# Patient Record
Sex: Male | Born: 1950 | Race: White | Hispanic: No | Marital: Single
Health system: Southern US, Community
[De-identification: ages and names within clinical notes are randomized; demographics above are authoritative.]

---

## 2000-06-11 ENCOUNTER — Inpatient Hospital Stay (HOSPITAL_COMMUNITY): Admission: EM | Admit: 2000-06-11 | Discharge: 2000-06-12 | Payer: Self-pay | Admitting: Emergency Medicine

## 2001-02-10 ENCOUNTER — Emergency Department (HOSPITAL_COMMUNITY): Admission: EM | Admit: 2001-02-10 | Discharge: 2001-02-10 | Payer: Self-pay | Admitting: Emergency Medicine

## 2002-10-15 ENCOUNTER — Emergency Department (HOSPITAL_COMMUNITY): Admission: EM | Admit: 2002-10-15 | Discharge: 2002-10-15 | Payer: Self-pay | Admitting: Emergency Medicine

## 2004-10-16 ENCOUNTER — Ambulatory Visit: Payer: Self-pay

## 2013-06-15 ENCOUNTER — Inpatient Hospital Stay: Payer: Self-pay | Admitting: Internal Medicine

## 2013-06-15 LAB — URINALYSIS, COMPLETE
Bacteria: NONE SEEN
Bilirubin,UR: NEGATIVE
Blood: NEGATIVE
Glucose,UR: NEGATIVE mg/dL (ref 0–75)
Ketone: NEGATIVE
Leukocyte Esterase: NEGATIVE
Nitrite: NEGATIVE
Ph: 6 (ref 4.5–8.0)
Protein: NEGATIVE
SPECIFIC GRAVITY: 1.004 (ref 1.003–1.030)
SQUAMOUS EPITHELIAL: NONE SEEN
WBC UR: NONE SEEN /HPF (ref 0–5)

## 2013-06-15 LAB — CBC
HCT: 44.2 % (ref 40.0–52.0)
HGB: 15.1 g/dL (ref 13.0–18.0)
MCH: 31.6 pg (ref 26.0–34.0)
MCHC: 34.3 g/dL (ref 32.0–36.0)
MCV: 92 fL (ref 80–100)
PLATELETS: 222 10*3/uL (ref 150–440)
RBC: 4.79 10*6/uL (ref 4.40–5.90)
RDW: 13.5 % (ref 11.5–14.5)
WBC: 6.7 10*3/uL (ref 3.8–10.6)

## 2013-06-15 LAB — COMPREHENSIVE METABOLIC PANEL
Albumin: 3.4 g/dL (ref 3.4–5.0)
Alkaline Phosphatase: 66 U/L
Anion Gap: 2 — ABNORMAL LOW (ref 7–16)
BUN: 12 mg/dL (ref 7–18)
Bilirubin,Total: 0.4 mg/dL (ref 0.2–1.0)
Calcium, Total: 9 mg/dL (ref 8.5–10.1)
Chloride: 103 mmol/L (ref 98–107)
Co2: 30 mmol/L (ref 21–32)
Creatinine: 1.03 mg/dL (ref 0.60–1.30)
EGFR (African American): >60
GLUCOSE: 99 mg/dL (ref 65–99)
OSMOLALITY: 270 (ref 275–301)
POTASSIUM: 3.5 mmol/L (ref 3.5–5.1)
SGOT(AST): 22 U/L (ref 15–37)
SGPT (ALT): 18 U/L (ref 12–78)
Sodium: 135 mmol/L — ABNORMAL LOW (ref 136–145)
Total Protein: 6.4 g/dL (ref 6.4–8.2)

## 2013-06-15 LAB — MAGNESIUM: MAGNESIUM: 1.7 mg/dL — AB

## 2013-06-15 LAB — TROPONIN I

## 2013-06-16 LAB — MAGNESIUM: Magnesium: 2 mg/dL

## 2013-06-16 LAB — LIPID PANEL
Cholesterol: 112 mg/dL (ref 0–200)
HDL Cholesterol: 34 mg/dL — ABNORMAL LOW (ref 40–60)
Ldl Cholesterol, Calc: 60 mg/dL (ref 0–100)
Triglycerides: 92 mg/dL (ref 0–200)
VLDL CHOLESTEROL, CALC: 18 mg/dL (ref 5–40)

## 2014-01-21 ENCOUNTER — Emergency Department: Payer: Self-pay | Admitting: Emergency Medicine

## 2014-01-21 LAB — CBC WITH DIFFERENTIAL/PLATELET
Basophil #: 0.1 10*3/uL (ref 0.0–0.1)
Basophil %: 0.8 %
Eosinophil #: 0.3 10*3/uL (ref 0.0–0.7)
Eosinophil %: 3.6 %
HCT: 46.2 % (ref 40.0–52.0)
HGB: 15.6 g/dL (ref 13.0–18.0)
Lymphocyte #: 1.2 10*3/uL (ref 1.0–3.6)
Lymphocyte %: 12.5 %
MCH: 31.6 pg (ref 26.0–34.0)
MCHC: 33.7 g/dL (ref 32.0–36.0)
MCV: 94 fL (ref 80–100)
Monocyte #: 0.9 x10 3/mm (ref 0.2–1.0)
Monocyte %: 9.8 %
Neutrophil #: 7.1 10*3/uL — ABNORMAL HIGH (ref 1.4–6.5)
Neutrophil %: 73.3 %
Platelet: 272 10*3/uL (ref 150–440)
RBC: 4.93 10*6/uL (ref 4.40–5.90)
RDW: 13.9 % (ref 11.5–14.5)
WBC: 9.6 10*3/uL (ref 3.8–10.6)

## 2014-01-21 LAB — URINALYSIS, COMPLETE
Bacteria: NONE SEEN
Bilirubin,UR: NEGATIVE
Blood: NEGATIVE
Glucose,UR: NEGATIVE mg/dL (ref 0–75)
Ketone: NEGATIVE
Leukocyte Esterase: NEGATIVE
Nitrite: NEGATIVE
Ph: 6 (ref 4.5–8.0)
Protein: NEGATIVE
RBC,UR: <1 /HPF (ref 0–5)
Specific Gravity: 1.008 (ref 1.003–1.030)
Squamous Epithelial: NONE SEEN
WBC UR: <1 /HPF (ref 0–5)

## 2014-01-21 LAB — BASIC METABOLIC PANEL
Anion Gap: 5 — ABNORMAL LOW (ref 7–16)
BUN: 10 mg/dL (ref 7–18)
Calcium, Total: 8.7 mg/dL (ref 8.5–10.1)
Chloride: 106 mmol/L (ref 98–107)
Co2: 27 mmol/L (ref 21–32)
Creatinine: 0.88 mg/dL (ref 0.60–1.30)
EGFR (African American): >60
EGFR (Non-African Amer.): >60
Glucose: 100 mg/dL — ABNORMAL HIGH (ref 65–99)
Osmolality: 275 (ref 275–301)
Potassium: 4 mmol/L (ref 3.5–5.1)
Sodium: 138 mmol/L (ref 136–145)

## 2014-01-21 LAB — TROPONIN I: Troponin-I: <0.02 ng/mL

## 2014-01-26 ENCOUNTER — Emergency Department: Payer: Self-pay | Admitting: Emergency Medicine

## 2014-03-23 ENCOUNTER — Ambulatory Visit: Payer: Self-pay | Admitting: Internal Medicine

## 2014-04-10 ENCOUNTER — Inpatient Hospital Stay: Payer: Self-pay | Admitting: Internal Medicine

## 2014-04-10 LAB — COMPREHENSIVE METABOLIC PANEL
Albumin: 3.5 g/dL (ref 3.4–5.0)
Alkaline Phosphatase: 91 U/L
Anion Gap: 13 (ref 7–16)
BUN: 24 mg/dL — ABNORMAL HIGH (ref 7–18)
Bilirubin,Total: 1 mg/dL (ref 0.2–1.0)
Calcium, Total: 9.6 mg/dL (ref 8.5–10.1)
Chloride: 102 mmol/L (ref 98–107)
Co2: 23 mmol/L (ref 21–32)
Creatinine: 0.94 mg/dL (ref 0.60–1.30)
EGFR (African American): >60
EGFR (Non-African Amer.): >60
Glucose: 76 mg/dL (ref 65–99)
Osmolality: 278 (ref 275–301)
Potassium: 4.8 mmol/L (ref 3.5–5.1)
SGOT(AST): 101 U/L — ABNORMAL HIGH (ref 15–37)
SGPT (ALT): 36 U/L
Sodium: 138 mmol/L (ref 136–145)
Total Protein: 7.5 g/dL (ref 6.4–8.2)

## 2014-04-10 LAB — URINALYSIS, COMPLETE
Bacteria: NONE SEEN
Bilirubin,UR: NEGATIVE
Glucose,UR: NEGATIVE mg/dL (ref 0–75)
Leukocyte Esterase: NEGATIVE
Nitrite: NEGATIVE
Ph: 5 (ref 4.5–8.0)
RBC,UR: 1 /HPF (ref 0–5)
SPECIFIC GRAVITY: 1.021 (ref 1.003–1.030)
WBC UR: 1 /HPF (ref 0–5)

## 2014-04-10 LAB — PROTIME-INR
INR: 1
Prothrombin Time: 12.7 secs (ref 11.5–14.7)

## 2014-04-10 LAB — CK TOTAL AND CKMB (NOT AT ARMC)
CK, Total: 2929 U/L — ABNORMAL HIGH (ref 39–308)
CK-MB: 16.8 ng/mL — ABNORMAL HIGH (ref 0.5–3.6)

## 2014-04-10 LAB — CBC
HCT: 53.9 % — ABNORMAL HIGH (ref 40.0–52.0)
HGB: 17.7 g/dL (ref 13.0–18.0)
MCH: 30.7 pg (ref 26.0–34.0)
MCHC: 32.8 g/dL (ref 32.0–36.0)
MCV: 93 fL (ref 80–100)
Platelet: 271 10*3/uL (ref 150–440)
RBC: 5.77 10*6/uL (ref 4.40–5.90)
RDW: 13.5 % (ref 11.5–14.5)
WBC: 20.7 10*3/uL — ABNORMAL HIGH (ref 3.8–10.6)

## 2014-04-10 LAB — APTT: Activated PTT: <23 secs — ABNORMAL LOW (ref 23.6–35.9)

## 2014-04-10 LAB — TROPONIN I: Troponin-I: 0.02 ng/mL

## 2014-04-11 LAB — CBC WITH DIFFERENTIAL/PLATELET
Basophil #: 0.1 10*3/uL (ref 0.0–0.1)
Basophil %: 0.6 %
EOS PCT: 0.9 %
Eosinophil #: 0.1 10*3/uL (ref 0.0–0.7)
HCT: 38.7 % — ABNORMAL LOW (ref 40.0–52.0)
HGB: 12.9 g/dL — ABNORMAL LOW (ref 13.0–18.0)
LYMPHS PCT: 5.9 %
Lymphocyte #: 0.7 10*3/uL — ABNORMAL LOW (ref 1.0–3.6)
MCH: 31.1 pg (ref 26.0–34.0)
MCHC: 33.4 g/dL (ref 32.0–36.0)
MCV: 93 fL (ref 80–100)
MONO ABS: 1.2 x10 3/mm — AB (ref 0.2–1.0)
Monocyte %: 9.4 %
NEUTROS ABS: 10.5 10*3/uL — AB (ref 1.4–6.5)
NEUTROS PCT: 83.2 %
Platelet: 192 10*3/uL (ref 150–440)
RBC: 4.15 10*6/uL — ABNORMAL LOW (ref 4.40–5.90)
RDW: 13.1 % (ref 11.5–14.5)
WBC: 12.6 10*3/uL — ABNORMAL HIGH (ref 3.8–10.6)

## 2014-04-11 LAB — BASIC METABOLIC PANEL
Anion Gap: 6 — ABNORMAL LOW (ref 7–16)
BUN: 24 mg/dL — ABNORMAL HIGH (ref 7–18)
CALCIUM: 7.9 mg/dL — AB (ref 8.5–10.1)
CHLORIDE: 110 mmol/L — AB (ref 98–107)
Co2: 24 mmol/L (ref 21–32)
Creatinine: 0.95 mg/dL (ref 0.60–1.30)
EGFR (African American): >60
Glucose: 92 mg/dL (ref 65–99)
OSMOLALITY: 283 (ref 275–301)
POTASSIUM: 4.2 mmol/L (ref 3.5–5.1)
Sodium: 140 mmol/L (ref 136–145)

## 2014-04-11 LAB — CK: CK, Total: 1303 U/L — ABNORMAL HIGH (ref 39–308)

## 2014-04-11 LAB — TSH: THYROID STIMULATING HORM: 0.448 u[IU]/mL — AB

## 2014-04-12 LAB — CBC WITH DIFFERENTIAL/PLATELET
BASOS ABS: 0 10*3/uL (ref 0.0–0.1)
Basophil %: 0.4 %
EOS ABS: 0.2 10*3/uL (ref 0.0–0.7)
Eosinophil %: 2.2 %
HCT: 38.6 % — ABNORMAL LOW (ref 40.0–52.0)
HGB: 12.8 g/dL — AB (ref 13.0–18.0)
LYMPHS PCT: 5.9 %
Lymphocyte #: 0.6 10*3/uL — ABNORMAL LOW (ref 1.0–3.6)
MCH: 31.1 pg (ref 26.0–34.0)
MCHC: 33.1 g/dL (ref 32.0–36.0)
MCV: 94 fL (ref 80–100)
MONOS PCT: 9.3 %
Monocyte #: 1 x10 3/mm (ref 0.2–1.0)
NEUTROS PCT: 82.2 %
Neutrophil #: 9 10*3/uL — ABNORMAL HIGH (ref 1.4–6.5)
Platelet: 167 10*3/uL (ref 150–440)
RBC: 4.11 10*6/uL — ABNORMAL LOW (ref 4.40–5.90)
RDW: 13.1 % (ref 11.5–14.5)
WBC: 10.9 10*3/uL — ABNORMAL HIGH (ref 3.8–10.6)

## 2014-04-12 LAB — BASIC METABOLIC PANEL
Anion Gap: 5 — ABNORMAL LOW (ref 7–16)
BUN: 12 mg/dL (ref 7–18)
CHLORIDE: 106 mmol/L (ref 98–107)
CO2: 26 mmol/L (ref 21–32)
Calcium, Total: 7.7 mg/dL — ABNORMAL LOW (ref 8.5–10.1)
Creatinine: 0.73 mg/dL (ref 0.60–1.30)
EGFR (African American): >60
EGFR (Non-African Amer.): >60
Glucose: 91 mg/dL (ref 65–99)
Osmolality: 273 (ref 275–301)
Potassium: 3.7 mmol/L (ref 3.5–5.1)
SODIUM: 137 mmol/L (ref 136–145)

## 2014-04-12 LAB — CK: CK, Total: 667 U/L — ABNORMAL HIGH (ref 39–308)

## 2014-04-13 LAB — CBC WITH DIFFERENTIAL/PLATELET
Basophil #: 0.1 10*3/uL (ref 0.0–0.1)
Basophil %: 0.6 %
Eosinophil #: 0.3 10*3/uL (ref 0.0–0.7)
Eosinophil %: 1.3 %
HCT: 44.3 % (ref 40.0–52.0)
HGB: 14.6 g/dL (ref 13.0–18.0)
Lymphocyte #: 0.7 10*3/uL — ABNORMAL LOW (ref 1.0–3.6)
Lymphocyte %: 3.9 %
MCH: 30.8 pg (ref 26.0–34.0)
MCHC: 32.9 g/dL (ref 32.0–36.0)
MCV: 94 fL (ref 80–100)
Monocyte #: 1.3 x10 3/mm — ABNORMAL HIGH (ref 0.2–1.0)
Monocyte %: 6.9 %
Neutrophil #: 16.7 10*3/uL — ABNORMAL HIGH (ref 1.4–6.5)
Neutrophil %: 87.3 %
Platelet: 240 10*3/uL (ref 150–440)
RBC: 4.74 10*6/uL (ref 4.40–5.90)
RDW: 13 % (ref 11.5–14.5)
WBC: 19.1 10*3/uL — ABNORMAL HIGH (ref 3.8–10.6)

## 2014-04-13 LAB — BASIC METABOLIC PANEL
Anion Gap: 5 — ABNORMAL LOW (ref 7–16)
BUN: 9 mg/dL (ref 7–18)
Calcium, Total: 7.6 mg/dL — ABNORMAL LOW (ref 8.5–10.1)
Chloride: 100 mmol/L (ref 98–107)
Co2: 31 mmol/L (ref 21–32)
Creatinine: 0.77 mg/dL (ref 0.60–1.30)
Glucose: 161 mg/dL — ABNORMAL HIGH (ref 65–99)
Osmolality: 274 (ref 275–301)
Potassium: 3.3 mmol/L — ABNORMAL LOW (ref 3.5–5.1)
Sodium: 136 mmol/L (ref 136–145)

## 2014-04-13 LAB — TSH: THYROID STIMULATING HORM: 1.67 u[IU]/mL

## 2014-04-14 DIAGNOSIS — I361 Nonrheumatic tricuspid (valve) insufficiency: Secondary | ICD-10-CM

## 2014-04-14 LAB — CBC WITH DIFFERENTIAL/PLATELET
BASOS ABS: 0 10*3/uL (ref 0.0–0.1)
BASOS PCT: 0.2 %
EOS PCT: 0.8 %
Eosinophil #: 0.2 10*3/uL (ref 0.0–0.7)
HCT: 45.6 % (ref 40.0–52.0)
HGB: 15.3 g/dL (ref 13.0–18.0)
Lymphocyte #: 0.5 10*3/uL — ABNORMAL LOW (ref 1.0–3.6)
Lymphocyte %: 2.6 %
MCH: 30.9 pg (ref 26.0–34.0)
MCHC: 33.6 g/dL (ref 32.0–36.0)
MCV: 92 fL (ref 80–100)
MONO ABS: 1.6 x10 3/mm — AB (ref 0.2–1.0)
Monocyte %: 7.8 %
Neutrophil #: 18.3 10*3/uL — ABNORMAL HIGH (ref 1.4–6.5)
Neutrophil %: 88.6 %
PLATELETS: 271 10*3/uL (ref 150–440)
RBC: 4.96 10*6/uL (ref 4.40–5.90)
RDW: 13 % (ref 11.5–14.5)
WBC: 20.6 10*3/uL — ABNORMAL HIGH (ref 3.8–10.6)

## 2014-04-14 LAB — BASIC METABOLIC PANEL
ANION GAP: 7 (ref 7–16)
BUN: 9 mg/dL (ref 7–18)
CO2: 31 mmol/L (ref 21–32)
Calcium, Total: 8.1 mg/dL — ABNORMAL LOW (ref 8.5–10.1)
Chloride: 98 mmol/L (ref 98–107)
Creatinine: 0.79 mg/dL (ref 0.60–1.30)
EGFR (Non-African Amer.): >60
Glucose: 162 mg/dL — ABNORMAL HIGH (ref 65–99)
Osmolality: 274 (ref 275–301)
POTASSIUM: 3.1 mmol/L — AB (ref 3.5–5.1)
SODIUM: 136 mmol/L (ref 136–145)

## 2014-04-23 ENCOUNTER — Ambulatory Visit: Payer: Self-pay | Admitting: Internal Medicine

## 2014-04-23 DEATH — deceased

## 2014-08-14 NOTE — H&P (Signed)
PATIENT NAME:  Charles Potter, Charles Potter MR#:  [id number] DATE OF BIRTH:  Jan 08, 1951  DATE OF ADMISSION:  06/15/2013  PRIMARY CARE PHYSICIAN:   Mikel Cella, Dr. Theda Sers.   PRIMARY ONCOLOGIST:  At [facility].   CHIEF COMPLAINT: Left-sided weakness.    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 Over the past day, the patient has had left-sided weakness. He has limited strength in the left lower extremity and left upper extremity. He also had some balance issues. Over the past week, the patient has some generalized weakness. No other symptoms such as slurred speech or other neurological deficits have been reported; however, when the patient was sitting in the ER, the family said that he lost consciousness and was unresponsive for several minutes. ER physician was called. She thought maybe the patient had a seizure. Neurology was consulted. Dr. Manuella Ghazi, from the ER, recommended Keppra. The patient's family denies any shaking or tongue biting. No history of seizures, although with his history of brain mets, he is at low threshold for seizures.   REVIEW OF SYSTEMS:  CONSTITUTIONAL: No fever. Positive weakness. No weight loss or weight gain.  EYES: No blurred or double vision, glaucoma or cataracts.  ENT:  Positive hearing loss. No snoring, postnasal drip or epistaxis.  RESPIRATORY: No cough, wheezing, hemoptysis. Positive COPD. CARDIOVASCULAR:  No chest pain, orthopnea, edema, arrhythmias, dyspnea on exertion, palpitations.  GASTROINTESTINAL:  No nausea, vomiting, diarrhea, abdominal pain, melena or ulcers. GENITOURINARY:   No dysuria, hematuria. ENDOCRINE: No polyuria or polydipsia.  HEMATOLOGIC AND LYMPHATICS: No easy bruising, bleeding.  SKIN: No rash or lesions.  MUSCULOSKELETAL: Positive weakness on the left side.  NEUROLOGIC: No history of CVA or TIA.   PSYCHIATRIC: No history of depression.   PAST MEDICAL HISTORY: 1.  Lung cancer with mets to the brain.  2.  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  3.  COPD. 4.  Hyperlipidemia.    SOCIAL HISTORY: The patient smokes 1/2 pack a day. He has been trying to quit smoking. No  alcohol or IV drug use.   FAMILY HISTORY: Positive for Alzheimer's and uterine cancer.   PAST SURGICAL HISTORY:  CABG, 2 vessel, and appendectomy.   MEDICATIONS: 1.  Ranexa 1000 mg 1 tablet b.i.d.  2.  Proventil 2 puffs q.6 hours.  3.  Polyethylene glycol daily.  4.  Pantoprazole 40 mg daily.  5.  Nitroglycerin sublingual p.r.n. chest pain.  6.  Nasonex 2 sprays b.i.d.  7.  Metoprolol 25 daily.  8.  Metamucil 5 capsules daily.  9.  Magnesium oxide 400 mg b.i.d.  10.  Lorazepam 1 mg b.i.d.  11.  Imdur 30 mg daily.  12.  Flonase 1 spray daily.  13.  Docusate 100 mg t.i.d. p.r.n.  14.  Diphenhydramine 25 mg t.i.d. p.r.n.  15.  Plavix 75 mg daily.  16.  Budesonide/formoterol inhaler 2 puffs b.i.d. p.r.n.  17.  Atorvastatin 40 mg daily at bedtime.  18.  Aspirin 325 daily.   ALLERGIES:  CODEINE, ANAPHYLAXIS. MORPHINE, HALLUCINATIONS. PENICILLIN, HIVES. SULFA DRUGS.   PHYSICAL EXAMINATION: VITAL SIGNS: Temperature 97.5, pulse 68, respirations 18, blood pressure 118/75, 96% on room air.  GENERAL: The patient is very slow to respond, but does not appear to be in acute distress.  HEENT: Head is atraumatic. Pupils are round and reactive. Sclerae  anicteric. Mucous membranes are moist. Oropharynx is clear. NECK: Supple without JVD, carotid bruit or enlarged thyroid.  CARDIOVASCULAR: Regular rate and rhythm. No murmurs, gallops or rubs. PMI is not displaced.  LUNGS: Clear to auscultation without crackles, rales, rhonchi or wheezing. Normal to percussion.  ABDOMEN: Bowel sounds are positive. Nontender, nondistended. No hepatosplenomegaly. EXTREMITIES:  No clubbing, cyanosis or edema. NEUROLOGIC: Cranial  nerves II through XII are intact. He has 3/5 strength in the left lower extremity. He is not able to really lift the leg up, but he can move it somewhat. Left upper extremity: He has got about 4/5 strength. Right upper extremity 5/5 strength and right lower extremity 5/5 strength. Babinski sign is downgoing on both sides. Cerebellar exam is normal including finger-to-nose on the right side. He has weakness of the left side, so unable to do a good heel-to-shin and finger-to-nose test on the left side.   LABORATORY DATA: Urinalysis shows no LCE or nitrites. White blood cells 6.7, hemoglobin 15, hematocrit 44.2, platelets are 222. Sodium 135, potassium 3.5, chloride 103, bicarb 30, BUN 12, creatinine 1.03. Glucose is 99. ALT 18, AST 22, total protein 6.4, albumin 3.4. Alk phos is 66, bilirubin 0.4, calcium 9.0, magnesium 1.7. Troponin less than 0.02.   CT SCAN OF THE BRAIN: No evidence of acute ischemic or hemorrhagic infarct. No intracranial masses affected.   ASSESSMENT AND PLAN: A 64 year old male with a history of lung cancer with brain mets, who presents with left-sided weakness and possible seizure while in the ER, although the patient never had any kind of shaking or tremors or biting of the tongue.  1. Left-sided weakness, concern for cerebrovascular accident versus something related to metastatic disease to his brain. I have ordered an MRI for further clarification. If this is positive for a CVA, we will need to order an echocardiogram and carotid ultrasound. For now, we will continue Plavix. I have written for neuro checks q.4 hours, MRI of the brain as already mentioned. I will go ahead also and consult physical therapy.  2.  Questionable seizure versus syncope. The patient will be admitted to telemetry. Neurology has been consulted. We will continue Keppra as per neurology recommendations, and we will order EEG for the a.m.  3. Coronary artery disease/coronary artery bypass grafting. We will  continue outpatient medications.  4. Lung cancer. The patient receives his treatment at [facility]. For now, we will order an MRI. Further recommendations after this MRI of the brain.  5.  Hypo-magnesium.  We will replete.  6. History of chronic obstructive pulmonary disease, which seems stable. Continue outpatient medications.  7.  Tobacco dependence. I encouraged the patient to stop smoking. He is trying to quit smoking, which we continue to encourage. The patient was counseled for 3-1/2 minutes.   The patient is a FULL CODE STATUS.    TIME SPENT: Approximately 50 minutes.    ____________________________ Donell Beers. Benjie Karvonen, MD spm:dmm D: 06/15/2013 19:31:00 ET T: 06/15/2013 20:03:22 ET JOB#: [id number]  cc: Kyanna Mahrt P. Benjie Karvonen, MD, <Dictator> Alverda Skeans, MD at [facility] Tre[facility] MD ELECTRONICALLY SIGNED 06/16/2013 12:43

## 2014-08-14 NOTE — Consult Note (Signed)
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 Over the past day, the patient has had left-sided weakness GOT WORSE, IT STARTED IN 2008 PER PT. He has limited strength in the left lower extremity and left upper extremity. He also had some balance issues. Over the past week, the patient has some generalized weakness. No other symptoms such as slurred speech or other neurological deficits have been reported; however, when the patient was sitting in the ER, the family said that he lost consciousness and was unresponsive for several minutes. ER physician was called. She thought maybe the patient had a seizure. I TALKED TO ER PHYSICIAN WHO MENTION SUDDEN ONSET OF UNRESPONSIVENESS AFTER AN EPISODE OF STARING WITHOUT FOCAL TONIC/CLONIC EVENT, recommended Keppra. The patient's family denies any shaking or tongue biting. No history of seizures, although with his history of brain mets, he is at low threshold for seizures.   PSYCHIATRIC: No history of depression.  MEDICAL HISTORY: Lung cancer with mets to the brain.  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  COPD. Hyperlipidemia.   HISTORY: The patient smokes 1/2 pack a day. He has been trying to quit smoking. No  alcohol or IV drug use.  HISTORY: Positive for Alzheimer's and uterine cancer.  SURGICAL HISTORY:  CABG, 2 vessel, and appendectomy.    ROS:  General fatigue   HEENT no complaints   Lungs SOB   Cardiac no complaints   GI no complaints   GU no complaints   Musculoskeletal no complaints   Extremities no complaints   Skin no complaints   Neuro seizure   Endocrine no complaints   Psych no complaints   Past Medical/Surgical Hx:  Cancer,  Brain:   Allergies:  Codeine: Anaphylaxis  Penicillin: Hives  Morphine: Hallucinations  Sulfa drugs: Unknown  Vital Signs: **Vital Signs.:   24-Feb-15 12:50  Vital Signs Type Admission  Temperature Temperature (F) 98.3  Celsius 36.8  Temperature Source oral  Pulse Pulse 69  Respirations Respirations 18  Systolic BP Systolic BP 979  Diastolic BP (mmHg) Diastolic BP (mmHg) 76  Mean BP 89  Pulse Ox % Pulse Ox % 95  Pulse Ox Activity Level  At rest  Oxygen Delivery Room Air/ 21 %   EXAM: General Exam Patient looks appropriate of age, bald, warning glasses, well built, nourished and appropriately groomed.   Cardiovascular Exam: S1, S2 heart sounds present Carotid exam revealed no bruit Lung exam ronchi on right apical region. belly soft  Neurological Exam      Mental Status:      Alert,     Oriented to time, place, person and situation     Attention span and concentration seemed reduced     Memory seemed OK.     Intact naming, repetition, comprehension.       Followed 2 step commands - no dysarthria     Fund of knowledge seemed appropriate for age and health status.       Cranial Nerves:      Olfactory and vagus nerves not are examined      Visual fields were full      Pupils were equal, round and reactive to light and accommodation  Extra-ocular movements are normal      Facial sensations are normal      Face is symmetric      Finger rub was heard symmetric in both ears      Palate and uvular movements are normal and oral sensations are OK      Neck muscle strength and shoulder shrug is normal      Tongue protrusion and uvular elevation are normal       Motor Exam:      Tone is normal in all extremities, poor strength 4-/5 in left LE, 4+/5 right LE. ? left UE weakness vs poor efforts.      No abnormal movements, fasciculations or atrophy seen       Deep Tendon Reflexes:      symmetric 1 +      Right Toes are down going,  Left Toes are down going             Sensory Exam:      Sensations were reduced to light touch in all extremities            Co-ordination:      Finger to nose is normal            Gait:      Gait very slow and unsteady.  Lab Results: Hepatic:  23-Feb-15 16:36   Bilirubin, Total 0.4  Alkaline Phosphatase 66 (45-117 NOTE: New Reference Range 03/13/13)  SGPT (ALT) 18  SGOT (AST) 22  Total Protein, Serum 6.4  Albumin, Serum 3.4  Routine Chem:  23-Feb-15 16:36   Glucose, Serum 99  BUN 12  Creatinine (comp) 1.03  Sodium, Serum  135  Potassium, Serum 3.5  Chloride, Serum 103  CO2, Serum 30  Calcium (Total), Serum 9.0  Osmolality (calc) 270  eGFR (African American) >60  eGFR (Non-African American) >60 (eGFR values <56m/min/1.73 m2 may be an indication of chronic kidney disease (CKD). Calculated eGFR is useful in patients with stable renal function. The eGFR calculation will not be reliable in acutely ill patients when serum creatinine is changing rapidly. It is not useful in  patients on dialysis. The eGFR calculation may not be applicable to patients at the low and high extremes of body sizes, pregnant women, and vegetarians.)  Anion Gap  2  24-Feb-15 03:41   Magnesium, Serum 2.0 (1.8-2.4 THERAPEUTIC RANGE: 4-7 mg/dL TOXIC: > 10 mg/dL  -----------------------)  Cholesterol, Serum 112  Triglycerides, Serum 92  HDL (INHOUSE)  34  VLDL Cholesterol Calculated 18  LDL Cholesterol Calculated 60 (Result(s) reported on 16 Jun 2013 at 04:19AM.)  Cardiac:  23-Feb-15 16:36   Troponin I < 0.02 (0.00-0.05 0.05 ng/mL or less: NEGATIVE  Repeat testing in 3-6 hrs  if clinically indicated. >0.05 ng/mL: POTENTIAL  MYOCARDIAL INJURY. Repeat  testing in 3-6 hrs if  clinically indicated. NOTE: An increase or decrease  of 30% or more on serial  testing suggests a  clinically important change)  Routine UA:  23-Feb-15 16:57   Color (UA) Straw  Clarity (UA) Clear  Glucose (UA) Negative  Bilirubin (UA) Negative   Ketones (UA) Negative  Specific Gravity (UA) 1.004  Blood (UA) Negative  pH (UA) 6.0  Protein (UA) Negative  Nitrite (UA) Negative  Leukocyte Esterase (UA) Negative (Result(s) reported on 15 Jun 2013 at 05:28PM.)  RBC (UA) <1 /HPF  WBC (UA) NONE SEEN  Bacteria (UA) NONE SEEN  Epithelial Cells (UA) NONE SEEN  Mucous (UA) PRESENT (Result(s) reported on 15 Jun 2013 at 05:28PM.)  Routine Hem:  23-Feb-15 16:36   WBC (CBC) 6.7  RBC (CBC) 4.79  Hemoglobin (CBC) 15.1  Hematocrit (CBC) 44.2  Platelet Count (CBC) 222 (Result(s) reported on 15 Jun 2013 at 05:17PM.)  MCV 92  MCH 31.6  MCHC 34.3  RDW 13.5   Radiology Results: CT:    23-Feb-15 17:19, CT Head Without Contrast  CT Head Without Contrast   REASON FOR EXAM:    off balance, left side weak,  known metastatic   lung/brain cancer treated at [facility]:       PROCEDURE: CT  - CT HEAD WITHOUT CONTRAST  - Jun 15 2013  5:19PM     CLINICAL DATA:  Left-sided weakness and balance disturbance, known  metastatic disease to the brain.    EXAM:  CT HEAD WITHOUT CONTRAST    TECHNIQUE:  Contiguous axial images were obtained from the base of the skull  through the vertex without intravenous contrast.  COMPARISON:  No previous studies are available for comparison    FINDINGS:  There is mild diffuse cerebral atrophy with compensatory  ventriculomegaly. There is no intracranial hemorrhage nor  intracranial mass effect. There are no findings to suggest  intracranial edema. Minimally decreased density in the deep white  matter of both cerebral hemispheres is consistent with mild small  vessel ischemic type change. There is dense calcification in the  pineal region. The cerebellum and brainstem exhibit no acute  abnormalities. At bone window settings there is no lytic or blastic  lesion demonstrated. The observed portions of the paranasal sinuses  are clear.     IMPRESSION:  1. There is no evidence of an acute ischemic or  hemorrhagic  infarction.  2. No intracranial mass effect is demonstrated. There is very  minimal ventriculomegaly of the lateral ventricles not out of  proportion to the degree of diffuse atrophy present.  3. The cerebellum and brainstem exhibit no acute abnormalities on  this noncontrast study.  4. No acute calvarial abnormality is demonstrated.  5. Follow-up enhanced MRI would be useful if the patient can  tolerate the procedure. Contrast enhanced CT scanning is available  if the patient cannot tolerate the MRI.      Electronically Signed    By: David  Martinique    On: 06/15/2013 17:24         Verified By: DAVID A. 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 No obvious electrolyte imbalance except mild hypomagnesemia (corrected) (which can lower sz threshold). continue tele for cardiac causes of syncope.conti levetiracetamNO eeg - won't change long term plan lung cancer with brain mets, was being treated at [facility] - pt wants to be on hospice - pls arrange' Generalized fatigue - left sided weakness from 1 and 2.conti PTwill follow  Electronic Signatures: Ray Church (MD)  (Signed 24-Feb-15 17:40)  Authored: Consult, History of Present Illness, Review of Systems, PAST MEDICAL/SURGICAL HISTORY, ALLERGIES, NURSING VITAL SIGNS, Physical Exam-, LAB RESULTS, RADIOLOGY RESULTS, Recommendations   Last Updated: 24-Feb-15 17:40 by Ray Church (MD)

## 2014-08-14 NOTE — Discharge Summary (Signed)
PATIENT NAME:  Charles Potter, Charles Potter MR#:  [id number] DATE OF BIRTH:  May 21, 1950  DATE OF ADMISSION:  06/15/2013 DATE OF DISCHARGE:  06/17/2013  PRESENTING COMPLAINT: Left upper and lower extremity weakness and possible seizures.   DISCHARGE DIAGNOSES:  1.  Metastatic lung cancer with metastasis to the brain. Status post chemotherapy and radiation.  2.  Chronic left-sided weakness.  3.  Possible seizure.  4.  History of tobacco abuse.  5.  Hypertension.   DISCHARGE CONDITION: Fair.   MEDICATIONS:  1.  Aspirin 325 mg p.o. daily.  2.  Atorvastatin 40 mg b.i.d.  3.  Budesonide formoterol 80/4.5, two puffs b.i.d.  4.  Plavix 75 mg daily.  5.  Diphenhydramine 25 mg one tablet 3 times a day as needed.  6.  Docusate 100 mg one capsule 3 times a day as needed.  7.  Imdur 30 mg extended-release b.i.d.  8.  Lorazepam 1 mg p.o. b.i.d.  9.  Magnesium oxide 400 mg b.i.d.  10.  Metamucil 525 five capsules daily at bedtime.  11.  Metoprolol succinate 25 mg extended release p.o. daily.  12.  Nasonex 50 mcg/inhalations 2 sprays b.i.d.  13.  Nitroglycerin 0.4 mg sublingual as needed.  14.  Protonix 40 mg daily.  15.  Polyethylene glycol oral powder p.o. daily.  16.  Proventil HFA 90 mcg/inhalations 2 puffs every 6 hours as needed.  17.  Ranexa 1000 mg extended-release b.i.d.  18.  Fluticasone 50 mcg one spray daily.  19.  Keppra 500 mg b.i.d.   FOLLOWUP:  1.  Dr. Joycie Peekimothy Potter, oncology, at [facility] in 1 to 2 weeks.  2.  With your PCP in 2 to 4 weeks.   MRI of the brain with and without contrast shows at least 4 discrete enhancing mass lesions compatible with metastatic disease these lesions exhibit restricted effusion, this can be in the setting of small lung cancer. Chronic microvascular ischemia.   Lipid profile within normal limits. Magnesium is 2.0. Urinalysis negative for UTI. CBC within normal limits. Comprehensive metabolic panel within normal limits.   VITALS AT  DISCHARGE: Temperature 98.1. Pulse is 55. Blood pressure 106/69, sats are 98% on room air.   BRIEF HOSPITAL COURSE: Charles Potter is a very pleasant 64 year old Caucasian gentleman who has history of lung cancers with mets to the brain, gets chemo and whole-brain radiation at [facility] with Dr. Joycie Peekimothy Potter. He came in with:  1.  Increasing left upper and lower extremity weakness. He was admitted, ruled out with CVA with negative MRI findings. His aspirin and Plavix were continued. He was seen by PT who recommended ST rehab; however, the patient wanted to go home and [facility] has been arranged, which was include physical therapy. I did discuss the MRI findings with the patient's oncologist, Dr. Joycie Peekimothy Potter, at [facility], and from January MRI per Charles Potter, his brain lesions have somewhat decreased in size. The patient has a followup appointment Dr. Joycie Peekimothy Potter next week. He did not exhibit any more neuro deficits.  2.  Possible seizures. Unclear whether the patient did have some seizures. Family was not able to give exact symptoms; hence, the patient was seen by Charles Potter, neurology, and given his mets, would be safe to continue Keppra 500 b.i.d., which the patient was tolerating well.  3.  Hypertension. Home meds were continued.  4.  Ongoing tobacco abuse. The patient was advised on smoking cessation.  5.  Coronary artery disease status post  CABG. Outpatient home cardiac meds were continued.   Hospital stay otherwise remained stable.   CODE STATUS: THE PATIENT REMAINED A FULL CODE.   The patient will be discharged to home with [facility]. The discharge plan was discussed with the patient's daughter.   TIME SPENT: 40 minutes.   ____________________________ Charles Hail Allena Katz, Potter sap:np D: 06/17/2013 15:09:05 ET T: 06/17/2013 17:26:26 ET JOB#: [id number]  cc: Charles Surowiec A. Allena Katz, Potter, <Dictator> Charles Peek, Potter Charles Potter ELECTRONICALLY SIGNED 06/30/2013 15:23

## 2014-08-14 NOTE — Consult Note (Signed)
   Comments   I met with pt's 2 daughter, son and HCPOA, Amy. They have hda a chance to discuss options and feel that pt should be extubated to comfort care. Orders entered. Will extubate when family have had a chance to see pt.   Electronic Signatures: Yeslin Delio, Izora Gala (MD)  (Signed 23-Dec-15 13:41)  Authored: Palliative Care   Last Updated: 23-Dec-15 13:41 by Paisly Fingerhut, Izora Gala (MD)

## 2014-08-18 NOTE — H&P (Signed)
PATIENT NAME:  Charles Potter, Charles Potter MR#:  [id number] DATE OF BIRTH:  [birth year]  DATE OF ADMISSION:  04/10/2014  PRIMARY CARE PROVIDER: Nonlocal.  EMERGENCY DEPARTMENT REFERRING PHYSICIAN: Dr. Inocencio Homes.   CHIEF COMPLAINT: Syncope with collapse and rhabdomyolysis.   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 It is unclear for how long the patient was on the floor. There is concern that he might have collapse. The patient was brought to the ED. His daughters are here at the bedside. He is noted to have an elevated CPK of 2929, and his WBC count is elevated. The patient has had recurrent falls at home, according to the daughters. He also has had issues with hallucinations. They also report that he is on chronic pain medications. They are not sure why. The patient does complain of back pain. He also reports that he has been having some dry cough, but he has not had any fevers or chills. He is not sure when he fell. He denies any abdominal pain, nausea, vomiting. or diarrhea. He denies any urinary symptoms. He has some gait instability. He does have a history of lung cancer with mets. He is not sure when he saw his primary care provider in [place]. He states that it has been months, and  he has not followed up.   PAST MEDICAL HISTORY:  1. Lung cancer with mets to the brain followed by oncologist in [place]. 2. Coronary artery disease with 10 stents, status post CABG, 2-vessel.  3. History of smoking, continued nicotine abuse.  4. Hyperlipidemia.  5. GERD.   ALLERGIES: CODEINE, MORPHINE, PENICILLIN, AND SULFA DRUGS.   SOCIAL HISTORY: He continues to smoke 1/2 to 1 pack per day. He denies any alcohol or drug use.   FAMILY HISTORY: Positive for  Alzheimer and uterine cancer.   PAST SURGICAL HISTORY: CABG, 2 vessel and appendectomy.   MEDICATIONS: He is not sure of the medications. The family did not bring his medication list. We will have to obtain his medications.   Medication that he was on previously was Ranexa 1000 mg 1 tab p.o. b.i.d., Proventil 2 puffs q. 6 hours., polyethylene glycol 17 grams daily, Protonix 40 daily, Norco 5/325 1 tab p.o. t.i.d., Nitrostat 0.4 sublingual, Nasonex 50 mcg 2 sprays to each nostril b.i.d., metoprolol succinate 25 p.o. daily, magnesium oxide 400 mg 1 tab p.o. b.i.d., lorazepam 1 mg b.i.d., Keppra 500 1 tab p.o. q. 12 hours, isosorbide mononitrate 30 one tab p.o. b.i.d., fluticasone 50 mcg daily, Colace 100 t.i.d., diphenhydramine 25 one tab p.o. t.i.d. as needed for itching, Plavix 75 p.o. daily budesonide 2 puffs b.i.d., atorvastatin 40 daily, aspirin 325 p.o. daily.   REVIEW OF SYSTEMS:  CONSTITUTIONAL: Complains of generalized weakness, fatigue, no weight gain, no weight loss.  HEENT: Denies any blurred vision or double vision. No cataracts. No glaucoma.  EARS, NOSE, AND THROAT: Positive for hearing loss. No snoring, no postnasal drip. No epistaxis. No difficulty swallowing.  RESPIRATORY: Complains of cough, but no wheezing. No hemoptysis. Positive for COPD.  CARDIOVASCULAR: Denies any chest pain, orthopnea, edema, or arrhythmia. No dyspnea on exertion.  GASTROINTESTINAL: No nausea. Recurrent vomiting, according to the daughters. No abdominal pain. No melena.  GENITOURINARY: Denies any dysuria or  hematuria.  ENDOCRINE: Denies any polyuria or nocturia.  HEMATOLOGIC: Denies any easy bruisability or bleeding.  SKIN: No rash, lesion.  MUSCULOSKELETAL: Denies any joint pains or swelling. No gout.  NEUROLOGIC: No CVA or TIA.  PSYCHIATRIC: Does have anxiety, but then no bipolar or schizophrenia.   PHYSICAL EXAMINATION: VITAL SIGNS: Temperature 97.8, pulse 99, respirations 12, blood pressure 121/76.   GENERAL: The patient is a chronically ill-appearing male, appears acutely dehydrated.  HEENT: Head atraumatic, normocephalic. Pupils equally round, reactive to light and accommodation. There is no conjunctival pallor. No scleral icterus. Nasal exam shows no drainage or ulceration.  OROPHARYNX: Clear without any exudate.  NECK: Supple without any thyromegaly.  CARDIOVASCULAR: Regular rate and rhythm. No murmurs, rubs, clicks, or gallops.  LUNGS: Occasional wheezing. No rhonchi. No accessory muscle usage.  ABDOMEN: Soft, nontender, nondistended. Positive bowel sounds x 4.  EXTREMITIES: No clubbing, cyanosis, or edema.  SKIN: No rash.  LYMPH NODES: Nonpalpable.  MUSCULOSKELETAL: There is no erythema or swelling.  VASCULAR: Good DP, PT pulses.  NEUROLOGIC: Cranial nerves II through XII grossly intact. No focal deficits.   LABORATORY DATA EVALUATIONS: Glucose 76, BUN 24, creatinine 0.94, sodium 138, potassium 4.8, chloride 102, CO2 23, calcium 9.6. LFTs are normal except AST of 101. CPK 2929. CK-MB is 16.8. Troponin 0.02. WBC 20.7, hemoglobin 17.7, INR 1.0. Urinalysis: Nitrites negative, leukocytes negative.   IMAGING STUDIES: Chest x-ray shows no acute disease. Thoracic spine: No acute abnormality. Lumbar spine: No acute findings. CT of the head without contrast showed no intracranial trauma, atrophy, or microvascular disease. New fluid in the right mastoid air cell. No cervical spine fracture.   ASSESSMENT AND PLAN: The patient is a 64 year old white male with history of lung metastases to the brain, coronary artery disease, chronic obstructive pulmonary disease, chronic pain, hyperlipidemia found by family on the floor. Was on the floor for unknown amount of time.  1.  Fall and syncope, possibly due to the patient being on sedating medications, also dehydration. At this time, he will be on telemetry. We will give him IV fluids. We will hold his sedating medications, obtain an accurate list of his  medications.  2.  Rhabdomyolysis due to a fall. We will give him IV fluids. Follow CPK. 3.  Lung cancer with brain metastases. Prognosis poor. I will ask palliative care to assist with his code status.  4.  Coronary artery disease. We will continue Ranexa, Imdur, metoprolol,  Plavix, and aspirin.  5.  Hyperlipidemia. Hold atorvastatin due to rhabdomyolysis.  6.  Chronic obstructive pulmonary disease. We will continue his inhalers as taking at home.  7. Nicotine addiction. Smoking cessation, 4 minutes spent. He will be started on a nicotine patch. I strongly recommended that he stop smoking.    TIME SPENT: 60 minutes on this patient's H and P.   ____________________________ Serita GritShreyang H. Allena KatzPatel, MD shp:mw D: 04/10/2014 17:37:35 ET T: 04/10/2014 19:30:34 ET JOB#: [id number]  cc: Oliviagrace Crisanti H. Allena KatzPatel, MD, <Dictator> Charise CarwinSHREYANG H Mikylah Ackroyd MD ELECTRONICALLY SIGNED 04/25/2014 12:18

## 2014-08-18 NOTE — Discharge Summary (Signed)
PATIENT NAME:  Charles Potter, Charles Potter MR#:  [id number] DATE OF BIRTH:  January 01, 1951  DATE OF ADMISSION:  04/10/2014 DATE OF DISCHARGE:  04/15/2014  DISPOSITION:  Discharged to hospice home.   ADMITTING DIAGNOSES: Fall and syncope, rhabdomyolysis, lung cancer with brain metastasis, coronary artery disease, hyperlipidemia, chronic obstructive pulmonary disease, and nicotine addiction  DISCHARGE DIAGNOSES:  1. Syncope. 2. Stage IV lung cancer with brain metastasis, likely contributing to syncope episode, 3. Rhabdomyolysis due to a fall.  4. Acute systolic congestive heart failure due to fluid overload for rhabdomyolysis.  5. Coronary artery disease.  6. Hyperlipidemia.  7.  Hypertension due to acute systolic congestive heart failure.  8. Acute respiratory failure due to acute systolic congestive heart failure, pulmonary edema.   DISCHARGE CONDITION: Poor.   DISCHARGE MEDICATIONS: The patient is being discharged to hospice home with: 1. Isosorbide mononitrate 30 mg twice daily. 2. Norco 5/325 mg 1 tablet every 4 hours as needed.  3. Haldol 2 mg after  meals and at bedtime as needed.  4. Tylenol 650 mg rectal suppository 1 suppository every 4 hours as needed.  5. Hydromorphone 0.5 mg injectable every 1 hour as needed.  6. Zofran 4 mg injectable every 4 hours as needed.  7. Furosemide 20 mg injectable every 12 hours.   The patient is not to take, atorvastatin, Symbicort, Plavix, Benadryl, magnesium Metamucil, metoprolol, Nasonex, Nitrostat, pantoprazole, MiraLax, Proventil, Ranexa, fluticasone, aspirin, Docusate, lorazepam unless recommended by primary care physician.   CONSULTANTS: Care management, social work, Dr. Belia Heman, Dr. Harvie Junior, Mr. Laurette Schimke.   RADIOLOGIC STUDIES: Lumbar spine AP and lateral x-rays, 04/10/2014, showed no acute findings. Thoracic spine AP and lateral x-ray, 04/10/2014, showed no acute findings. Chest portable single-view x-ray 04/10/2014 revealed no acute disease. A  portable single-view chest x-ray on the 04/13/2014, showed interval development of diffuse bilateral airspace disease and small pleural effusion, most consistent with congestive heart failure and edema. CT of cervical spine as well as CT of head without contrast, 04/10/2014, revealed no intracranial trauma, atrophy and microvascular disease, small brain metastasis seen on comparison MRI were not identified. New fluid in the right mastoid air cells. No cervical spine fracture.   HISTORY OF PRESENT ILLNESS: The patient is a 64 year old male with past medical history significant for history of lung carcinoma with history of brain metastasis, who presents to the hospital with complaints of syncope as well as collapse and rhabdomyolysis after collapse. Please refer to Dr. Serita Grit Patel's admission note on 04/10/2014. On arrival to the hospital, the patient's temperature was 97.8, pulse was 99, respiration was 12, blood pressure 121/76, saturation was not checked. The patient's vital signs were unremarkable. EKG done on arrival to the hospital, as well as the patient's labs  done on arrival to the hospital revealed mild elevation of BUN to 24, otherwise BMP was unremarkable. The patient's AST was elevated at 101. CK, however, was high at 129, MB fraction was 16.8. Troponin was 0.02. The patient's initial TSH was low at 0.448; however, repeated was 1.67. White blood cell count was elevated to 20.7, hemoglobin was 17.7, and platelet count was 271. Coagulation panel was unremarkable. Urinalysis was unremarkable. The patient's ABGs for just venous, pH was  found to be 7.28, pCO2 was 48. EKG showed sinus tachycardia with premature atrial complexes at 115 beats per minute. Right superior axis deviation, pulmonary disease pattern, septal infarct which was already cited before, however, it is 115, and premature atrial complexes which were new, were also noted. Chest  x-ray was unremarkable. The patient was admitted to the  hospital for further evaluation. He was started on IV fluids; however, with IV fluid administration, even though the patient's rhabdomyolysis improved, he became more and more short of breath. He deteriorated and  was transferred to the critical care unit and intubated on 04/14/2014. He was placed on mechanical ventilation, as well as Levophed for significant hypotension. Palliative care consulted the patient, and the patient's family decided that he needs to be terminally extubated and discharged to hospice home. The patient was extubated and since his condition declined,  he is being discharged to hospice home today on the 04/15/2014. On the day of discharge, he is nonverbal. He was not able to communicate or open his eyes. He remained obtunded and he had closed mouth breathing with significant apnea episodes. He is being discharged to hospice home for final management of his medical problems and for comfort care.   TIME SPENT: 40 minutes.   ____________________________ Katharina Caperima Kaedan Richert, MD rv:mw D: 04/15/2014 18:20:38 ET T: 04/15/2014 18:47:55 ET JOB#: [id number]  cc: Katharina Caperima Aithan Farrelly, MD, <Dictator> Jeffrey Voth MD ELECTRONICALLY SIGNED 05/05/2014 20:58

## 2015-06-10 IMAGING — CT CT HEAD WITHOUT CONTRAST
2 of 6 series · 13 of 47 positions shown, 16 images · non-contrast
Comparison: MRI 06/16/2013, head CT 01/21/2014

CLINICAL DATA: Fall this morning. Perfusion. History of lung
cancer. History of brain metastasis.

EXAM:
CT HEAD WITHOUT CONTRAST
CT CERVICAL SPINE WITHOUT CONTRAST
TECHNIQUE: Multidetector CT imaging of the head and cervical spine was
performed following the standard protocol without intravenous
contrast. Multiplanar CT image reconstructions of the cervical spine
were also generated.

[Series 9: cor bone · coronal · 0.22mm/px · 3 of 50 slices shown]
[im 17/50  brain]
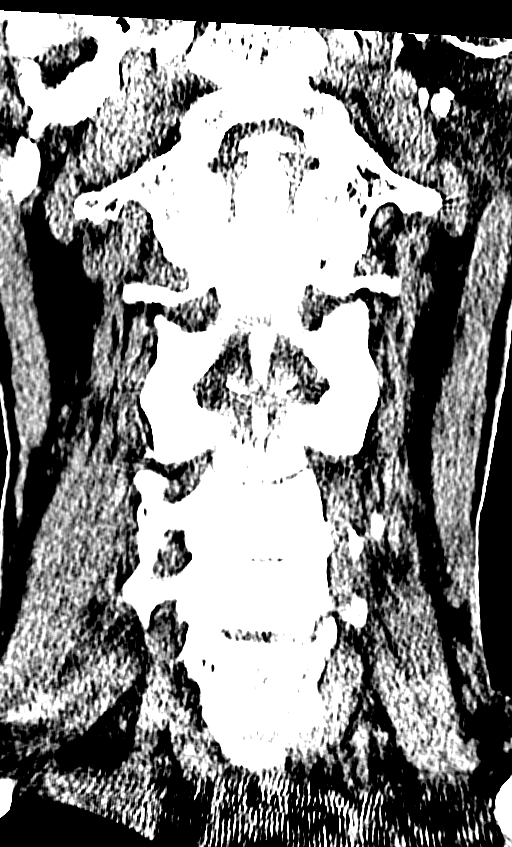
[im 22/50  brain]
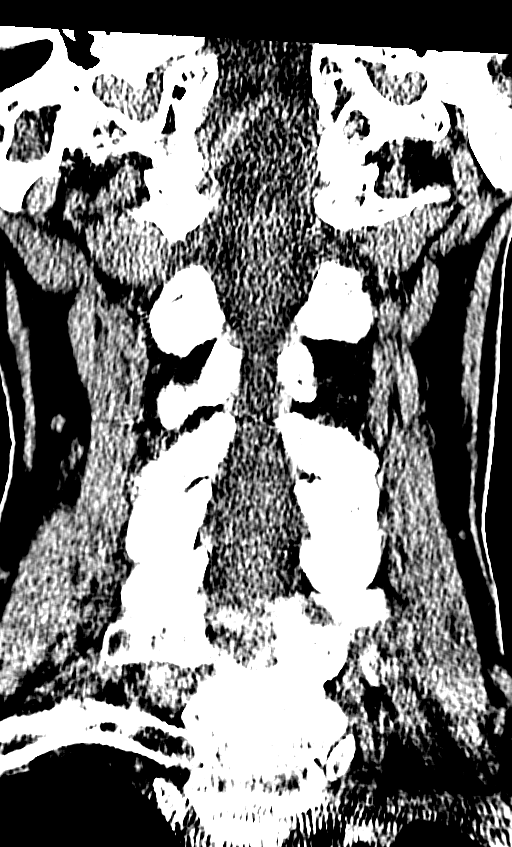
[im 28/50  brain]
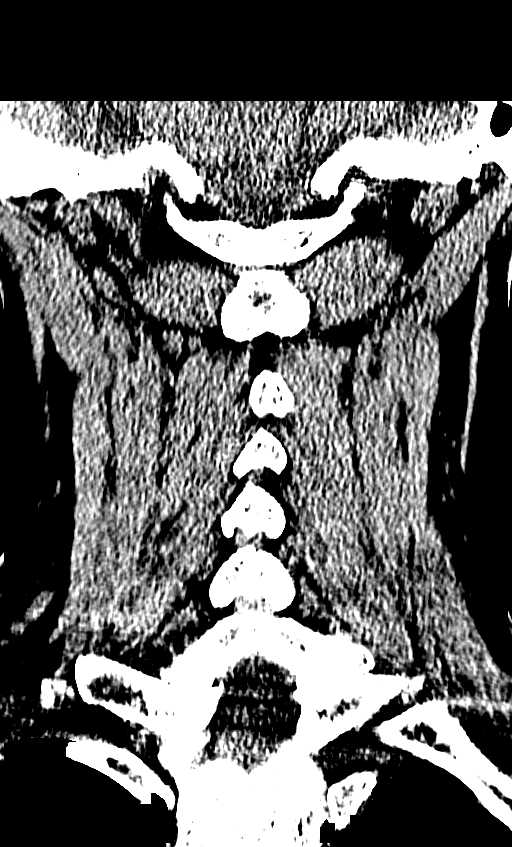

[Series 10: orthogonal axials · axial · 0.17mm/px · z∈[-351,-220]mm · 10 of 86 slices shown, 13 images]
[im 8/86  brain]
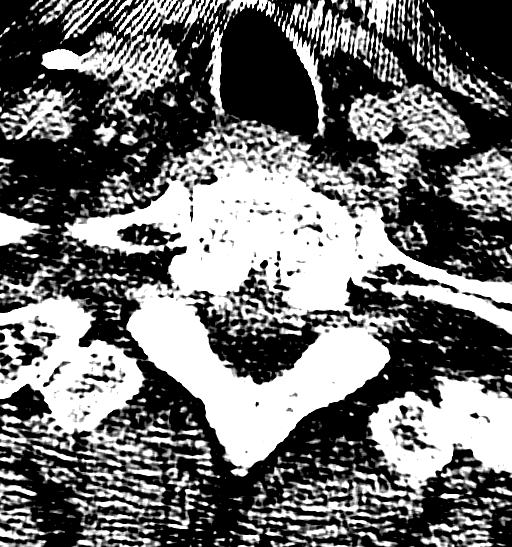
[im 8/86  bone]
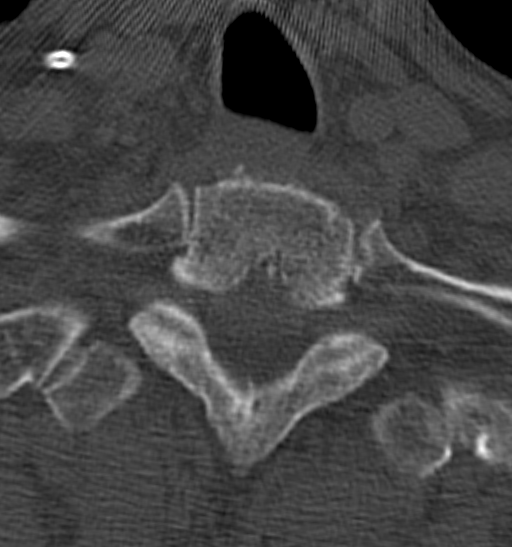
[im 16/86  brain]
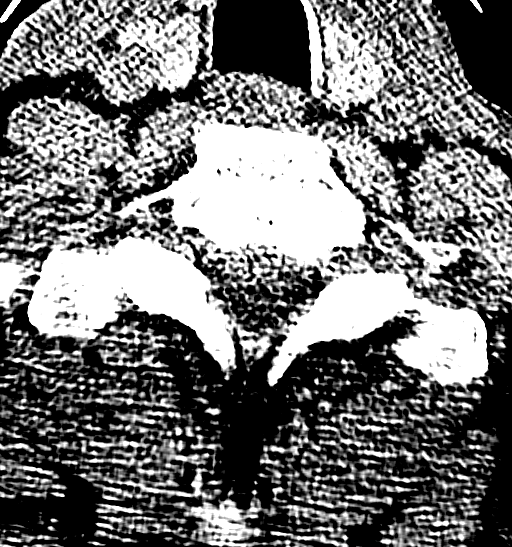
[im 24/86  brain]
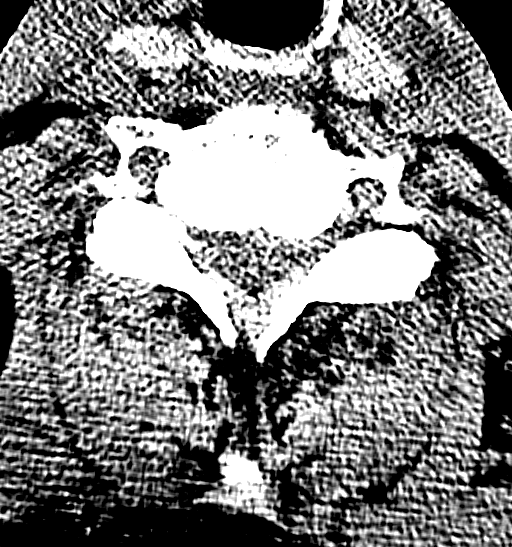
[im 31/86  brain]
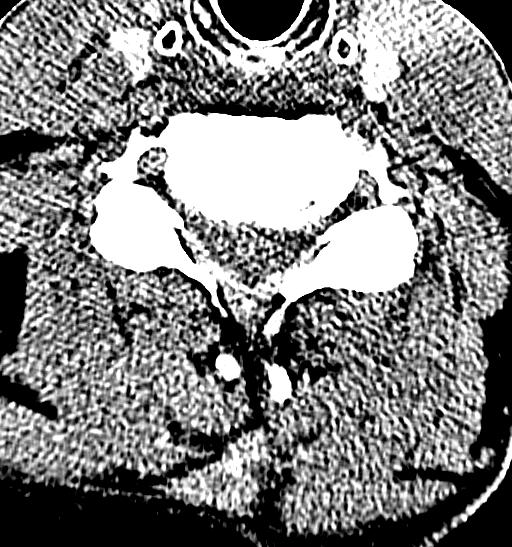
[im 39/86  brain]
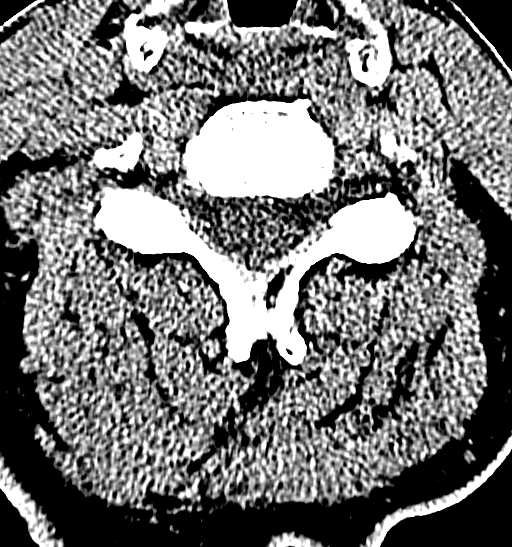
[im 39/86  bone]
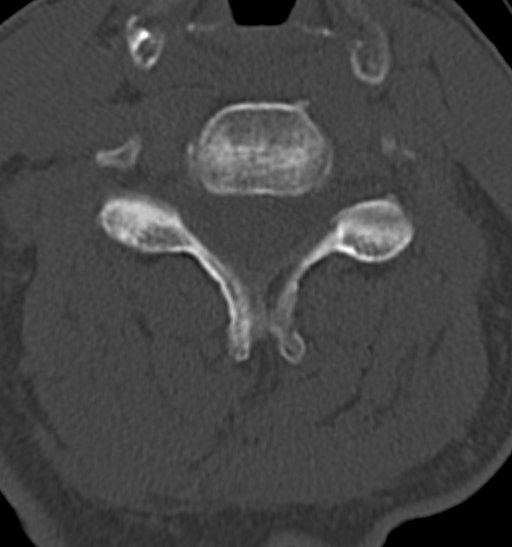
[im 47/86  brain]
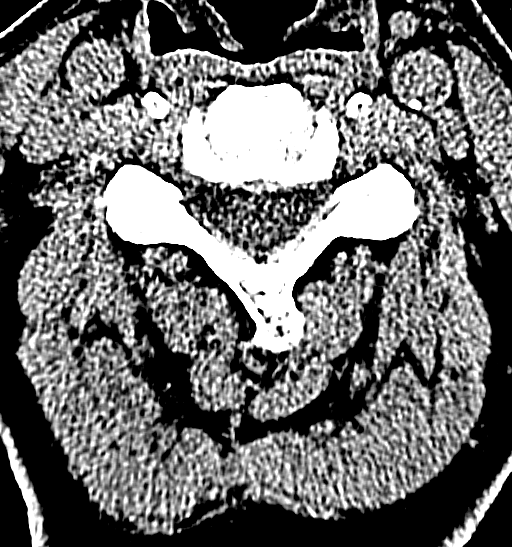
[im 55/86  brain]
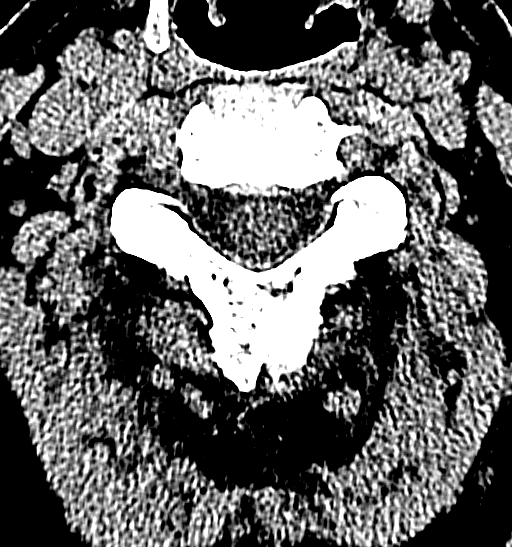
[im 62/86  brain]
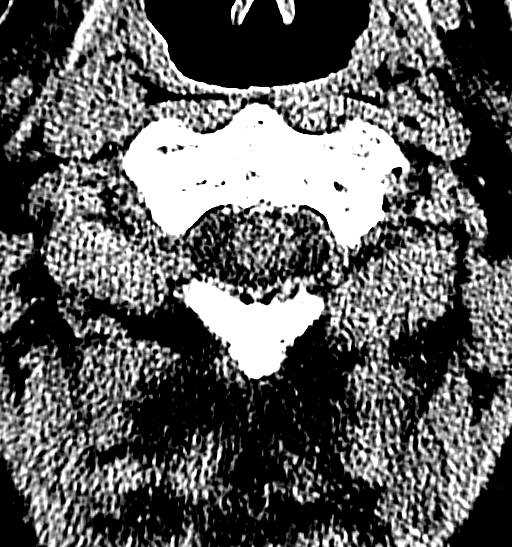
[im 70/86  brain]
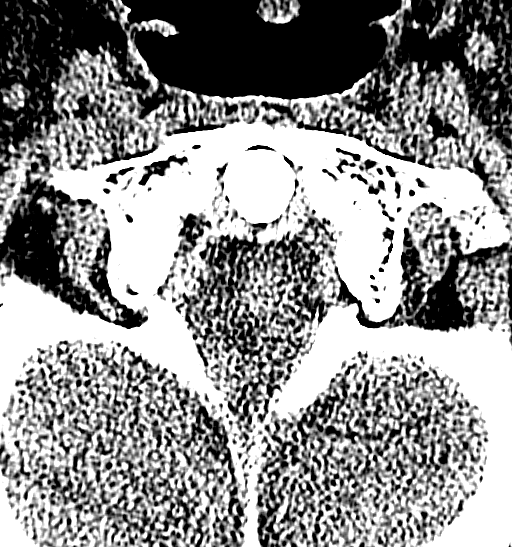
[im 70/86  bone]
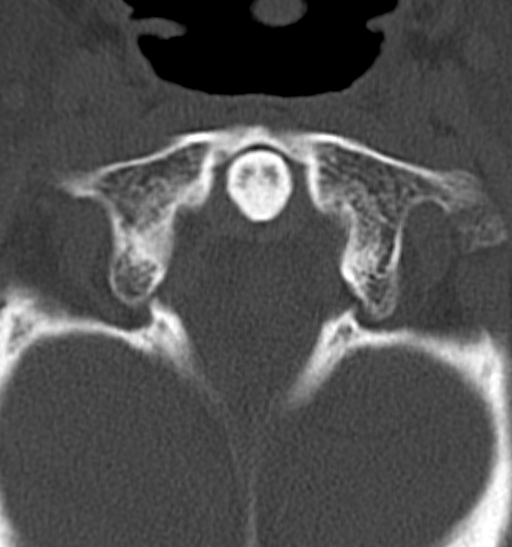
[im 78/86  brain]
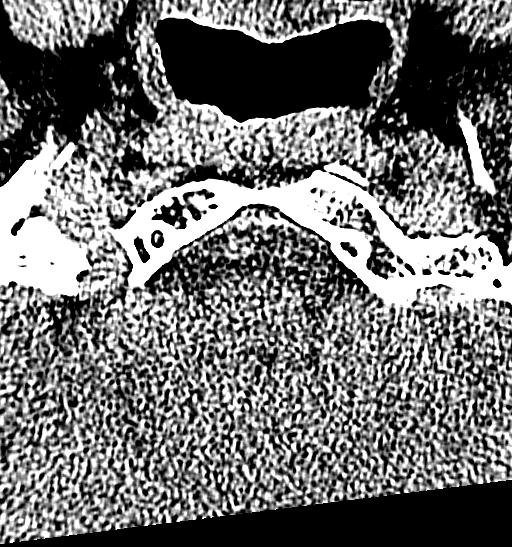

[13 of 47 positions shown; findings below may reference images not displayed]

FINDINGS: CT HEAD FINDINGS

No intracranial hemorrhage. No parenchymal contusion. No midline
shift or mass effect. Basilar cisterns are patent. No skull base
fracture. No fluid in the paranasal sinuses or mastoid air cells.
Orbits are normal.

There is generalized cortical atrophy and white matter microvascular
disease not changed from prior. No evidence of cerebral metastasis.
Small lesions were detected by MRI.

There is opacification of the right mastoid air cells which is new
from prior.

CT CERVICAL SPINE FINDINGS

No prevertebral soft tissue swelling. Normal alignment of cervical
vertebral bodies. No loss of vertebral body height. Normal facet
articulation. Normal craniocervical junction.

There is endplate degeneration with sclerosis and osteophytosis at
C5-C6. No acute subluxation.

No evidence epidural or paraspinal hematoma.
IMPRESSION: 1. No intracranial trauma.
2. Atrophy and microvascular disease.
3. Small brain metastasis seen on comparison MRI are not identified.
4. New fluid in the right mastoid air cells.
5. No cervical spine fracture.

## 2015-06-13 IMAGING — CR DG CHEST 1V PORT
1 series · 1 of 1 positions shown · non-contrast
Comparison: 800 hr today

CLINICAL DATA: Respiratory failure

EXAM:
PORTABLE CHEST - 1 VIEW

[ap]
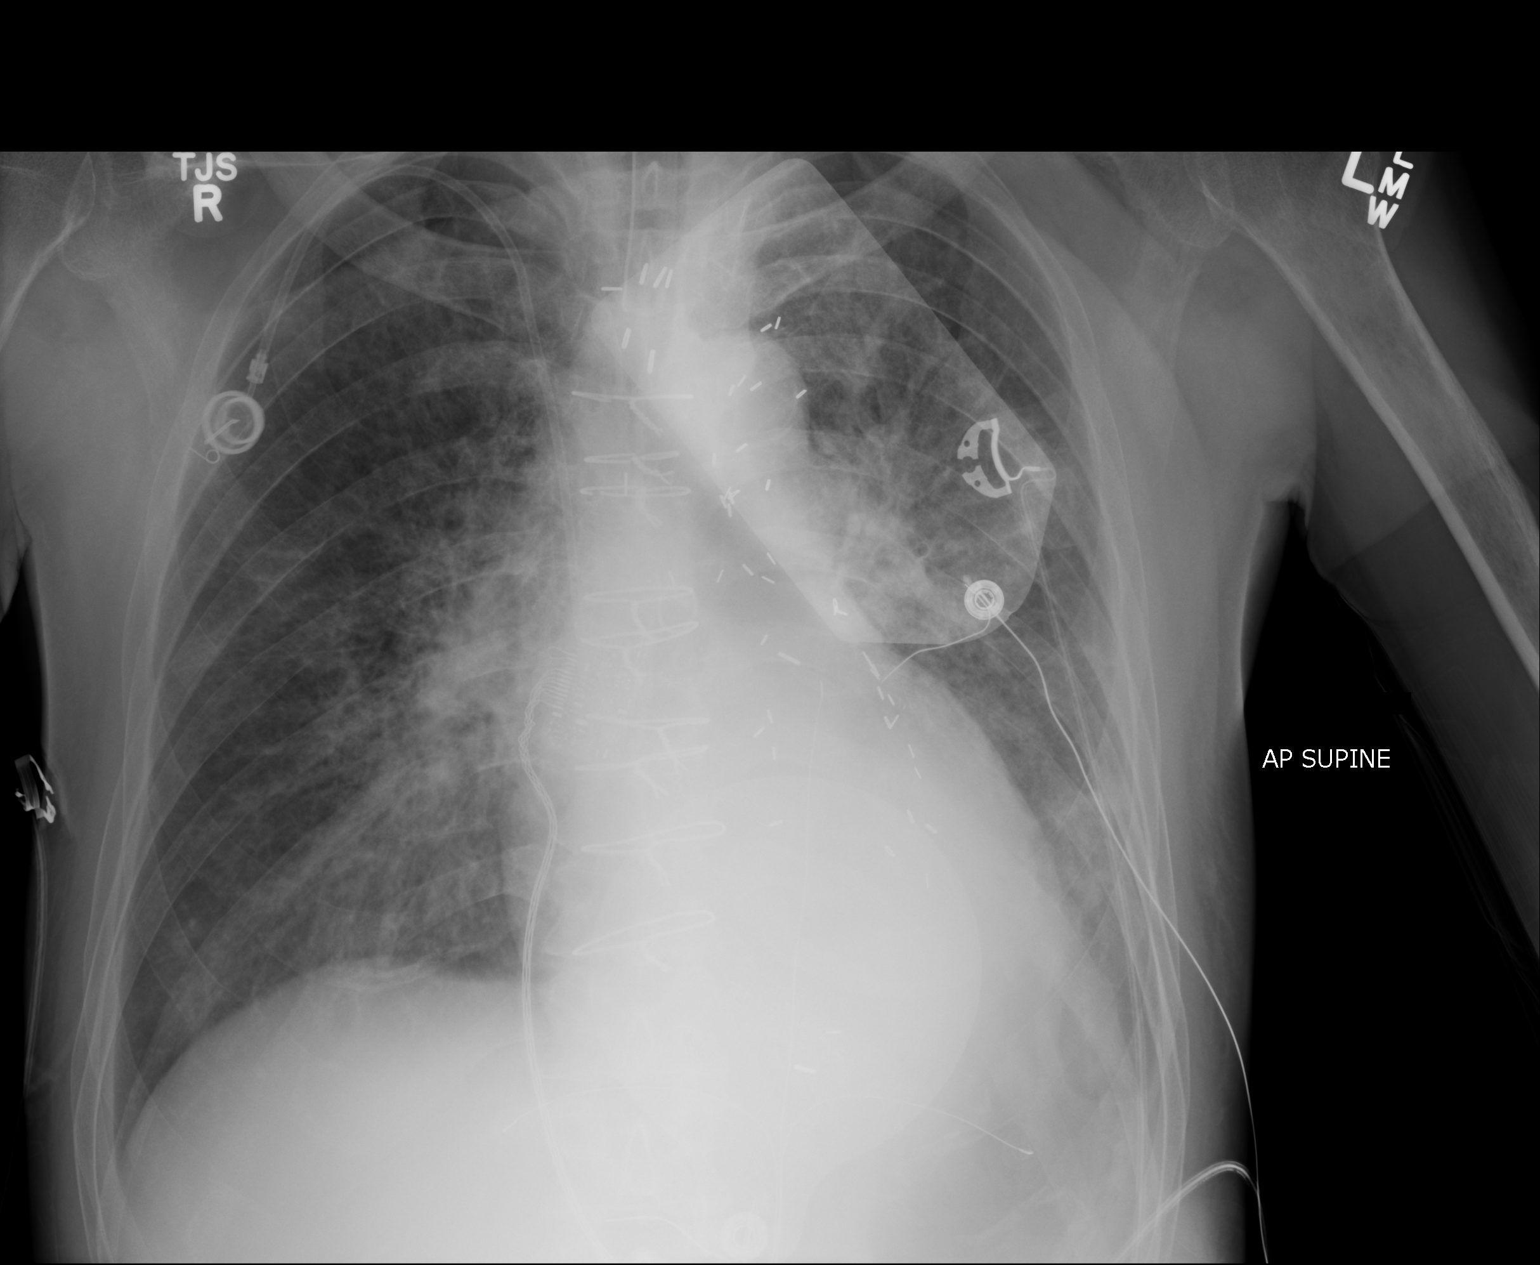

[1 of 1 positions shown; findings below may reference images not displayed]

FINDINGS: Endotracheal tube placed. Tip is 4.4 cm from the carina. Pulmonary
edema has improved. Left basilar opacity persists. Stable
Port-A-Cath. No pneumothorax.
IMPRESSION: Endotracheal tube placed.

Improved edema.
